# Patient Record
Sex: Male | Born: 1960 | Race: White | Hispanic: No | Marital: Married | Smoking: Former smoker
Health system: Southern US, Community
[De-identification: ages and names within clinical notes are randomized; demographics above are authoritative.]

## PROBLEM LIST (undated history)

## (undated) DIAGNOSIS — M549 Dorsalgia, unspecified: Secondary | ICD-10-CM

## (undated) DIAGNOSIS — F419 Anxiety disorder, unspecified: Secondary | ICD-10-CM

## (undated) DIAGNOSIS — E079 Disorder of thyroid, unspecified: Secondary | ICD-10-CM

## (undated) DIAGNOSIS — E785 Hyperlipidemia, unspecified: Secondary | ICD-10-CM

## (undated) DIAGNOSIS — F32A Depression, unspecified: Secondary | ICD-10-CM

## (undated) DIAGNOSIS — M25569 Pain in unspecified knee: Secondary | ICD-10-CM

## (undated) DIAGNOSIS — K219 Gastro-esophageal reflux disease without esophagitis: Secondary | ICD-10-CM

## (undated) HISTORY — PX: REPLACEMENT TOTAL KNEE: SUR1224

## (undated) HISTORY — PX: HAND SURGERY: SHX662

## (undated) HISTORY — PX: ROTATOR CUFF REPAIR: SHX139

---

## 2010-04-11 ENCOUNTER — Emergency Department (HOSPITAL_BASED_OUTPATIENT_CLINIC_OR_DEPARTMENT_OTHER): Admission: EM | Admit: 2010-04-11 | Discharge: 2010-04-11 | Payer: Self-pay | Admitting: Emergency Medicine

## 2017-06-11 ENCOUNTER — Ambulatory Visit (INDEPENDENT_AMBULATORY_CARE_PROVIDER_SITE_OTHER): Payer: Self-pay

## 2017-06-11 ENCOUNTER — Other Ambulatory Visit: Payer: Self-pay | Admitting: Emergency Medicine

## 2017-06-11 DIAGNOSIS — R52 Pain, unspecified: Secondary | ICD-10-CM

## 2017-06-11 DIAGNOSIS — M19011 Primary osteoarthritis, right shoulder: Secondary | ICD-10-CM

## 2019-10-16 ENCOUNTER — Other Ambulatory Visit: Payer: Self-pay

## 2019-10-16 ENCOUNTER — Emergency Department (INDEPENDENT_AMBULATORY_CARE_PROVIDER_SITE_OTHER): Payer: BC Managed Care – PPO

## 2019-10-16 ENCOUNTER — Emergency Department
Admission: EM | Admit: 2019-10-16 | Discharge: 2019-10-16 | Disposition: A | Discharge status: Home / Self Care | Payer: BC Managed Care – PPO | Source: Home / Self Care | Attending: Family Medicine | Admitting: Family Medicine

## 2019-10-16 DIAGNOSIS — G8929 Other chronic pain: Secondary | ICD-10-CM | POA: Diagnosis not present

## 2019-10-16 DIAGNOSIS — M545 Low back pain, unspecified: Secondary | ICD-10-CM

## 2019-10-16 DIAGNOSIS — M5136 Other intervertebral disc degeneration, lumbar region: Secondary | ICD-10-CM

## 2019-10-16 MED ORDER — PREDNISONE 50 MG PO TABS: ORAL_TABLET | ORAL | 0 refills | Status: DC

## 2019-10-16 NOTE — ED Triage Notes (Signed)
Pt c/o lower back pain x 1 mos. Tried tylenol and blu emu cream with no relief. Has PCP but has not seen her about this issue.

## 2019-10-16 NOTE — Discharge Instructions (Addendum)
Apply ice pack to lower back for 20 to 30 minutes, 3 to 4 times daily  Continue until pain and swelling decrease.  After finishing prednisone, may begin Ibuprofen 200mg , 4 tabs every 8 hours with food.

## 2019-10-16 NOTE — ED Provider Notes (Signed)
[facility]    CSN: [id number] Arrival date & time: 10/16/19  1536      History   Chief Complaint Chief Complaint  Patient presents with  . Back Pain    lower    HPI Steven Becker is a 58 y.o. male.   Patient has a history of recurring low back pain.  About a month ago he developed increased pain after using a lawn aerator, and during the past week his pain has been constant.  His pain is primarily in his right lower back and does not radiate.   He denies bowel or bladder dysfunction, and no saddle numbness.  Review of chart records:  LS spine X-ray ([facility]) done 02/03/16 revealed mild multilevel disc space narrowing and endplate osteophyte formation.  The history is provided by the patient.  Back Pain Location:  Lumbar spine Quality:  Aching Radiates to:  Does not radiate Pain severity:  Moderate Pain is:  Worse during the day Onset quality:  Gradual Duration:  1 month Timing:  Constant Progression:  Worsening Chronicity:  Recurrent Context: lifting heavy objects   Relieved by:  Nothing Worsened by:  Ambulation, bending, movement and twisting Ineffective treatments: Tylenol. Associated symptoms: no abdominal pain, no abdominal swelling, no bladder incontinence, no bowel incontinence, no dysuria, no fever, no leg pain, no numbness, no paresthesias, no pelvic pain, no perianal numbness, no tingling, no weakness and no weight loss   Risk factors: obesity     History reviewed. No pertinent past medical history.  There are no active problems to display for this patient.   History reviewed. No pertinent surgical history.     Home Medications    Prior to Admission medications   Medication Sig Start Date End Date Taking? Authorizing Provider  fenofibrate 54 MG tablet Take by mouth. 10/01/19  Yes [provider]  fluticasone (FLONASE) 50 MCG/ACT nasal spray Place into the nose. 12/23/18  Yes [provider]  levothyroxine (SYNTHROID) 75 MCG  tablet TAKE 1 TABLET BY MOUTH EVERY DAY 03/08/19  Yes [provider]  omeprazole (PRILOSEC OTC) 20 MG tablet Take by mouth. 03/08/19  Yes [provider]  sertraline (ZOLOFT) 50 MG tablet Take one tablet (50 MG Dose) by mouth daily. 03/08/19  Yes [provider]  simvastatin (ZOCOR) 40 MG tablet TAKE 1 TABLET BY MOUTH EVERYDAY AT BEDTIME 08/31/19  Yes [provider]  predniSONE (DELTASONE) 50 MG tablet Take one tab by mouth with food once daily for five days 10/16/19   Kandra Nicolas, MD  Testosterone 20.25 MG/ACT (1.62%) GEL SMARTSIG:3 Pump Topical Daily 09/11/19   [provider]    Family History History reviewed. No pertinent family history.  Social History Social History   Tobacco Use  . Smoking status: Never Smoker  . Smokeless tobacco: Never Used  Substance Use Topics  . Alcohol use: Yes    Comment: occ  . Drug use: Not on file     Allergies   Hydrocodone   Review of Systems Review of Systems  Constitutional: Positive for activity change. Negative for appetite change, chills, diaphoresis, fatigue, fever, unexpected weight change and weight loss.  Gastrointestinal: Negative for abdominal pain and bowel incontinence.  Genitourinary: Negative for bladder incontinence, dysuria and pelvic pain.  Musculoskeletal: Positive for back pain.  Skin: Negative.   Neurological: Negative for tingling, weakness, numbness and paresthesias.  All other systems reviewed and are negative.    Physical Exam Triage Vital Signs ED Triage Vitals  Enc Vitals Group     BP 10/16/19 1558 (!) 145/87     Pulse Rate 10/16/19 1558 86     Resp 10/16/19 1558 18     Temp 10/16/19 1558 98.6 F (37 C)     Temp Source 10/16/19 1558 Oral     SpO2 10/16/19 1558 97 %     Weight 10/16/19 1559 299 lb (135.6 kg)     Height 10/16/19 1559 6' (1.829 m)     Head Circumference --      Peak Flow --      Pain Score 10/16/19 1559 8     Pain Loc --      Pain Edu? --       Excl. in GC? --    No data found.  Updated Vital Signs BP (!) 145/87 (BP Location: Right Arm)   Pulse 86   Temp 98.6 F (37 C) (Oral)   Resp 18   Ht 6' (1.829 m)   Wt 135.6 kg   SpO2 97%   BMI 40.55 kg/m   Visual Acuity Right Eye Distance:   Left Eye Distance:   Bilateral Distance:    Right Eye Near:   Left Eye Near:    Bilateral Near:     Physical Exam Vitals signs and nursing note reviewed.  Constitutional:      General: He is not in acute distress.    Appearance: He is obese.  HENT:     Head: Normocephalic.     Nose: Nose normal.     Mouth/Throat:     Pharynx: Oropharynx is clear.  Eyes:     Pupils: Pupils are equal, round, and reactive to light.  Neck:     Musculoskeletal: Normal range of motion.  Cardiovascular:     Heart sounds: Normal heart sounds.  Pulmonary:     Breath sounds: Normal breath sounds.  Abdominal:     Tenderness: There is no abdominal tenderness.  Musculoskeletal:       Back:     Right lower leg: No edema.     Left lower leg: No edema.     Comments: Back:  Range of motion relatively well preserved.  Can heel/toe walk and squat without difficulty.    Tenderness in the midline from L3 to Sacral area.  There is right paraspinous tenderness from approximately L2 to sacrum.  There is mild left paraspinous tenderness as noted on diagram.  Straight leg raising test is negative.  Sitting knee extension test is negative.  Strength and sensation in the lower extremities is normal.  Patellar and achilles reflexes are normal   Skin:    General: Skin is warm and dry.     Findings: No rash.  Neurological:     Mental Status: He is alert.      UC Treatments / Results  Labs (all labs ordered are listed, but only abnormal results are displayed) Labs Reviewed - No data to display  EKG   Radiology Dg Lumbar Spine Complete  Result Date: 10/16/2019 CLINICAL DATA:  Low back pain, right greater than left, for 1 month. No reported injury.  EXAM: LUMBAR SPINE - COMPLETE 4+ VIEW COMPARISON:  None. FINDINGS: This report assumes 5 non rib-bearing lumbar vertebrae. Lumbar vertebral body heights are preserved, with no fracture. Moderate multilevel lumbar degenerative disc disease, most prominent at L5-S1. Minimal 3 mm retrolisthesis at L1-2. Mild bilateral lower lumbar facet arthropathy. No aggressive appearing focal osseous lesions. IMPRESSION: 1. No lumbar spine fracture. 2.  Moderate multilevel lumbar degenerative disc disease, most prominent at L5-S1. 3. Minimal 3 mm retrolisthesis at L1-2. 4. Mild bilateral lower lumbar facet arthropathy. Electronically Signed   By: Delbert Phenix M.D.   On: 10/16/2019 17:36    Procedures Procedures (including critical care time)  Medications Ordered in UC Medications - No data to display  Initial Impression / Assessment and Plan / UC Course  I have reviewed the triage vital signs and the nursing notes.  Pertinent labs & imaging results that were available during my care of the patient were reviewed by me and considered in my medical decision making (see chart for details).    Suspect a component of L5-S1 radiculopathy Begin prednisone 50mg  daily for 5 days. Followup with Dr. for further evaluation   Final Clinical Impressions(s) / UC Diagnoses   Final diagnoses:  Chronic right-sided low back pain without sciatica     Discharge Instructions     Apply ice pack to lower back for 20 to 30 minutes, 3 to 4 times daily  Continue until pain and swelling decrease.  After finishing prednisone, may begin Ibuprofen 200mg , 4 tabs every 8 hours with food.     ED Prescriptions    Medication Sig Dispense Auth. Provider   predniSONE (DELTASONE) 50 MG tablet Take one tab by mouth with food once daily for five days 5 tablet Steven Langton, MD        , MD 10/17/19 [phone]

## 2020-02-10 ENCOUNTER — Other Ambulatory Visit: Payer: Self-pay | Admitting: Internal Medicine

## 2020-02-10 DIAGNOSIS — U071 COVID-19: Secondary | ICD-10-CM

## 2020-02-10 NOTE — Progress Notes (Signed)
  I connected by phone with Steven Becker on 02/10/2020 at 11:59 AM to discuss the potential use of an new treatment for mild to moderate COVID-19 viral infection in non-hospitalized patients.  This patient is a 59 y.o. male that meets the FDA criteria for Emergency Use Authorization of bamlanivimab or casirivimab\imdevimab.  Has a (+) direct SARS-CoV-2 viral test result  Has mild or moderate COVID-19   Is ? 60 years of age and weighs ? 40 kg  Is NOT hospitalized due to COVID-19  Is NOT requiring oxygen therapy or requiring an increase in baseline oxygen flow rate due to COVID-19  Is within 10 days of symptom onset  Has at least one of the high risk factor(s) for progression to severe COVID-19 and/or hospitalization as defined in EUA.  Specific high risk criteria : BMI >/= 35   I have spoken and communicated the following to the patient or parent/caregiver:  1. FDA has authorized the emergency use of bamlanivimab and casirivimab\imdevimab for the treatment of mild to moderate COVID-19 in adults and pediatric patients with positive results of direct SARS-CoV-2 viral testing who are 90 years of age and older weighing at least 40 kg, and who are at high risk for progressing to severe COVID-19 and/or hospitalization.  2. The significant known and potential risks and benefits of bamlanivimab and casirivimab\imdevimab, and the extent to which such potential risks and benefits are unknown.  3. Information on available alternative treatments and the risks and benefits of those alternatives, including clinical trials.  4. Patients treated with bamlanivimab and casirivimab\imdevimab should continue to self-isolate and use infection control measures (e.g., wear mask, isolate, social distance, avoid sharing personal items, clean and disinfect "high touch" surfaces, and frequent handwashing) according to CDC guidelines.   5. The patient or parent/caregiver has the option to accept or refuse  bamlanivimab or casirivimab\imdevimab .  After reviewing this information with the patient, The patient agreed to proceed with receiving the bamlanimivab infusion and will be provided a copy of the Fact sheet prior to receiving the infusion.   Infusion scheduled for 2/19 at 1430.   Cyndee Brightly, NP-C [facility]  pgr [phone]  02/10/2020 11:59 AM

## 2020-02-11 ENCOUNTER — Encounter (HOSPITAL_COMMUNITY): Payer: Self-pay

## 2020-02-11 ENCOUNTER — Ambulatory Visit (HOSPITAL_COMMUNITY)
Admission: RE | Admit: 2020-02-11 | Discharge: 2020-02-11 | Disposition: A | Discharge status: Home / Self Care | Payer: BC Managed Care – PPO | Source: Ambulatory Visit | Attending: Pulmonary Disease | Admitting: Pulmonary Disease

## 2020-02-11 DIAGNOSIS — U071 COVID-19: Secondary | ICD-10-CM | POA: Diagnosis not present

## 2020-02-11 MED ORDER — EPINEPHRINE 0.3 MG/0.3ML IJ SOAJ: 0.3000 mg | Freq: Once | INTRAMUSCULAR | Status: DC | PRN

## 2020-02-11 MED ORDER — ALBUTEROL SULFATE HFA 108 (90 BASE) MCG/ACT IN AERS: 2.0000 | INHALATION_SPRAY | Freq: Once | RESPIRATORY_TRACT | Status: DC | PRN

## 2020-02-11 MED ORDER — METHYLPREDNISOLONE SODIUM SUCC 125 MG IJ SOLR: 125.0000 mg | Freq: Once | INTRAMUSCULAR | Status: DC | PRN

## 2020-02-11 MED ORDER — DIPHENHYDRAMINE HCL 50 MG/ML IJ SOLN: 50.0000 mg | Freq: Once | INTRAMUSCULAR | Status: DC | PRN

## 2020-02-11 MED ORDER — SODIUM CHLORIDE 0.9 % IV SOLN
700.0000 mg | Freq: Once | INTRAVENOUS | Status: AC
  Administered 2020-02-11: 15:00:00 700 mg via INTRAVENOUS
  Filled 2020-02-11: qty 20

## 2020-02-11 MED ORDER — SODIUM CHLORIDE 0.9 % IV SOLN
INTRAVENOUS | Status: DC | PRN
  Administered 2020-02-11: 15:00:00 250 mL via INTRAVENOUS

## 2020-02-11 MED ORDER — FAMOTIDINE IN NACL 20-0.9 MG/50ML-% IV SOLN: 20.0000 mg | Freq: Once | INTRAVENOUS | Status: DC | PRN

## 2020-02-11 NOTE — Discharge Instructions (Signed)
10 Things You Can Do to Manage Your COVID-19 Symptoms at Home If you have possible or confirmed COVID-19: 1. Stay home from work and school. And stay away from other public places. If you must go out, avoid using any kind of public transportation, ridesharing, or taxis. 2. Monitor your symptoms carefully. If your symptoms get worse, call your healthcare provider immediately. 3. Get rest and stay hydrated. 4. If you have a medical appointment, call the healthcare provider ahead of time and tell them that you have or may have COVID-19. 5. For medical emergencies, call 911 and notify the dispatch personnel that you have or may have COVID-19. 6. Cover your cough and sneezes with a tissue or use the inside of your elbow. 7. Wash your hands often with soap and water for at least 20 seconds or clean your hands with an alcohol-based hand sanitizer that contains at least 60% alcohol. 8. As much as possible, stay in a specific room and away from other people in your home. Also, you should use a separate bathroom, if available. If you need to be around other people in or outside of the home, wear a mask. 9. Avoid sharing personal items with other people in your household, like dishes, towels, and bedding. 10. Clean all surfaces that are touched often, like counters, tabletops, and doorknobs. Use household cleaning sprays or wipes according to the label instructions. cdc.gov/coronavirus 06/23/2019 This information is not intended to replace advice given to you by your health care provider. Make sure you discuss any questions you have with your health care provider. Document Revised: 11/25/2019 Document Reviewed: 11/25/2019 Elsevier Patient Education  2020 Elsevier Inc. Mr.  

## 2020-02-11 NOTE — Progress Notes (Signed)
  Diagnosis: COVID-19  Physician: Dr. Wright  Procedure: [facility] Med: bamlanivimab infusion - Provided patient with bamlanimivab fact sheet for patients, parents and caregivers prior to infusion.  Complications: No immediate complications noted.  Discharge: Discharged home   Steven Becker N [name] 02/11/2020  

## 2020-03-02 IMAGING — DX DG LUMBAR SPINE COMPLETE 4+V
5 series · 5 of 5 positions shown · non-contrast
Comparison: None.

CLINICAL DATA: Low back pain, right greater than left, for 1 month.
No reported injury.

EXAM:
LUMBAR SPINE - COMPLETE 4+ VIEW

[l-spine ap]
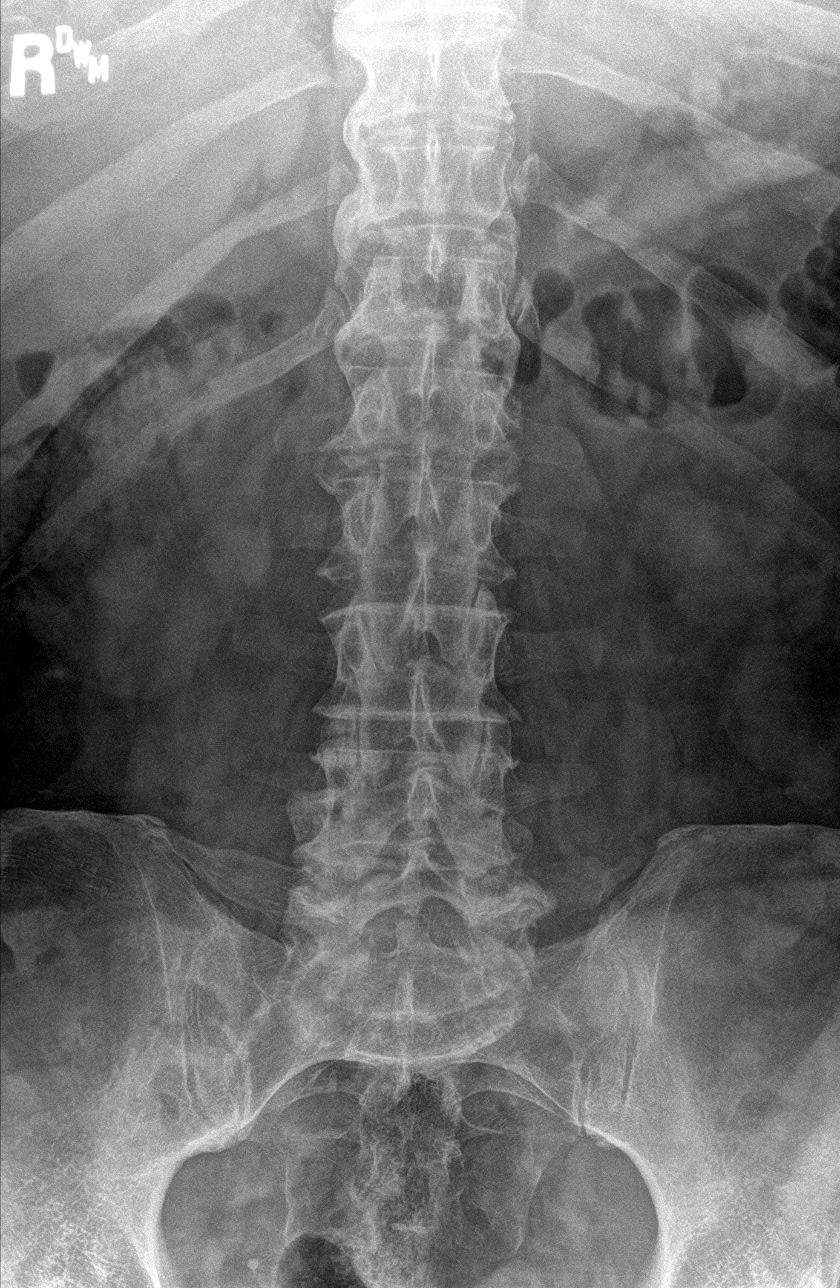

[l-spine obl (1 of 2)]
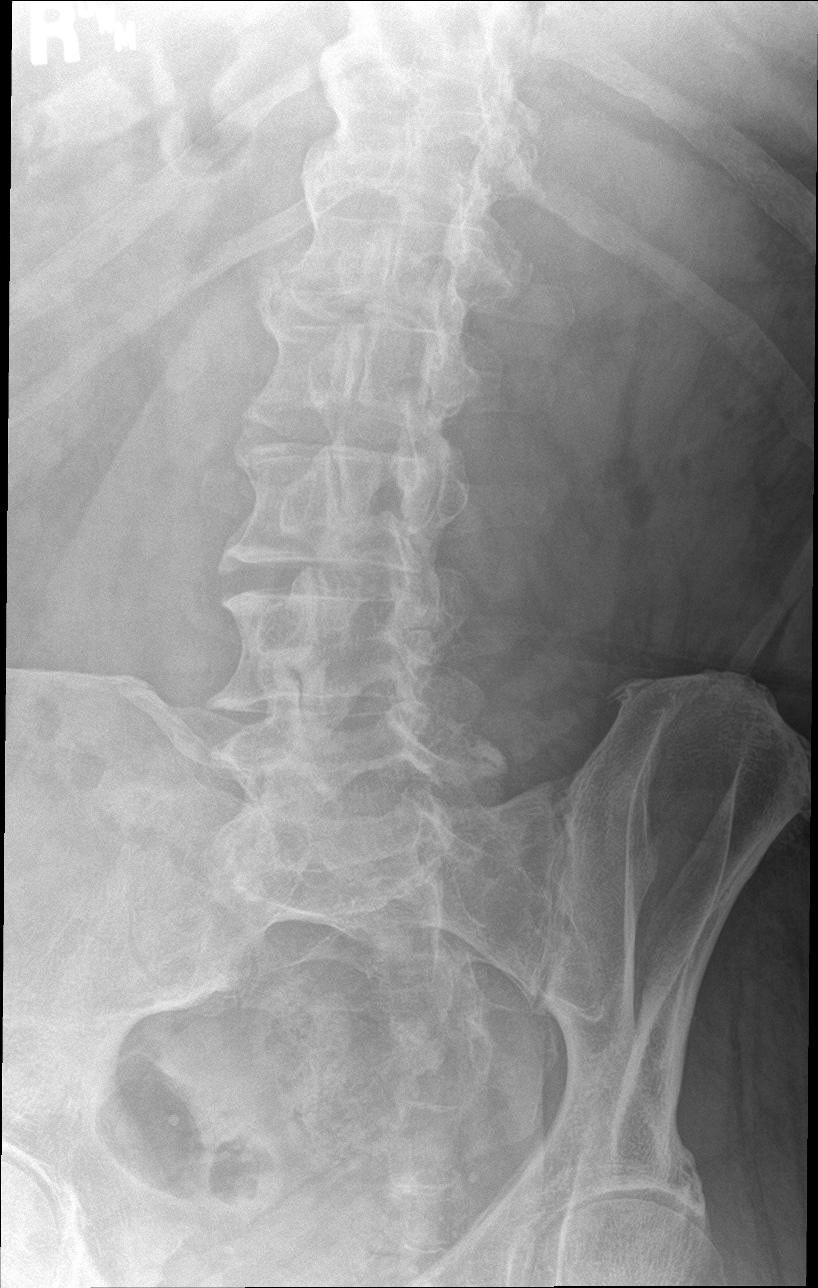

[l-spine obl (2 of 2)]
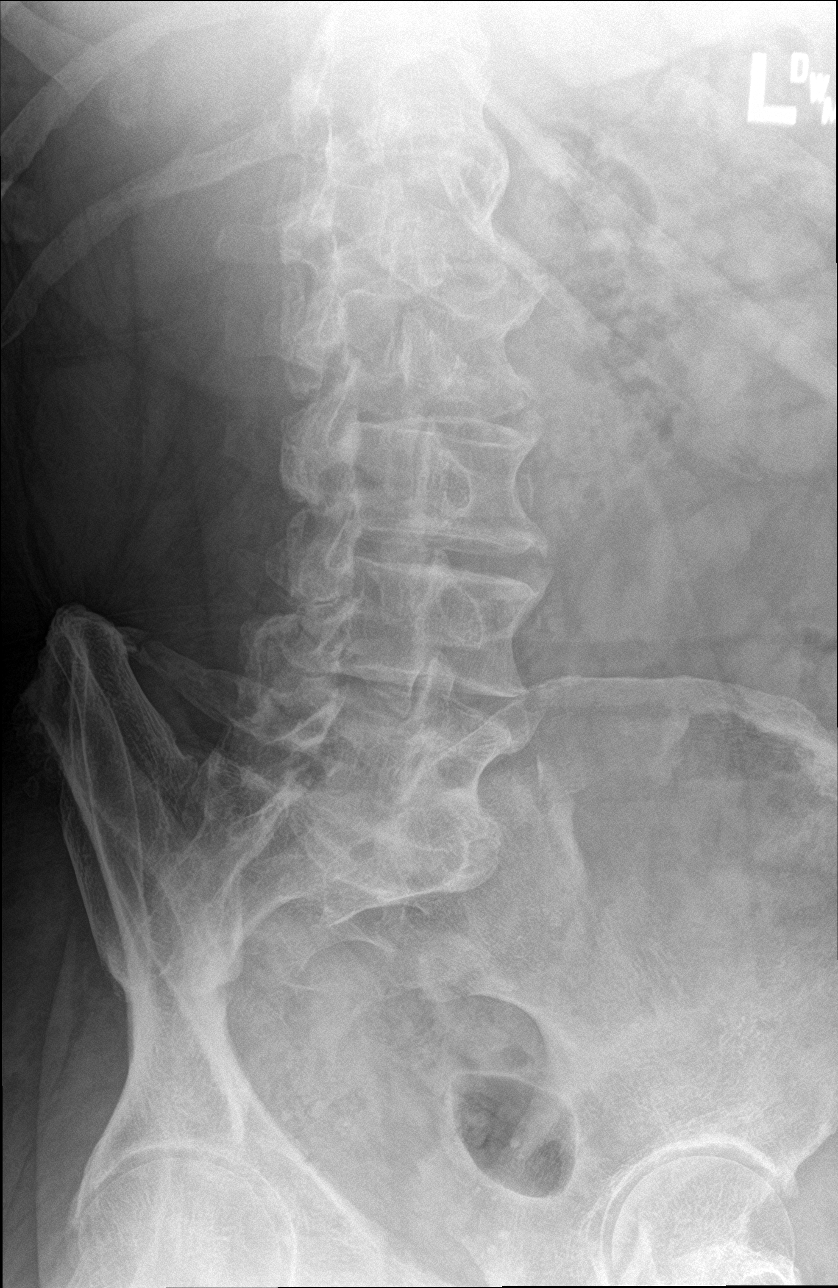

[l-spine lat (1 of 2)]
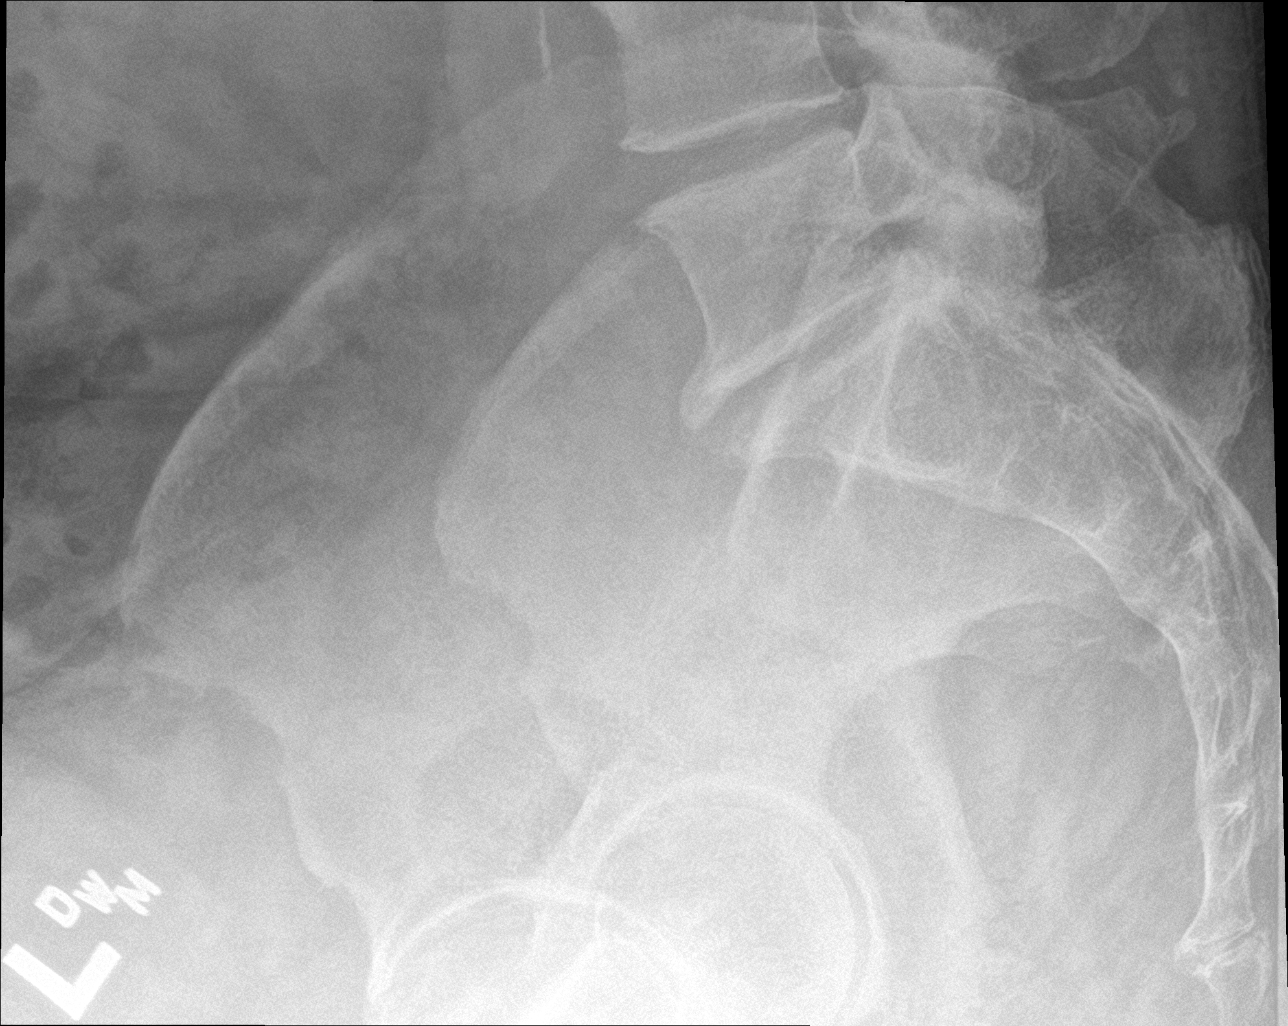

[l-spine lat (2 of 2)]
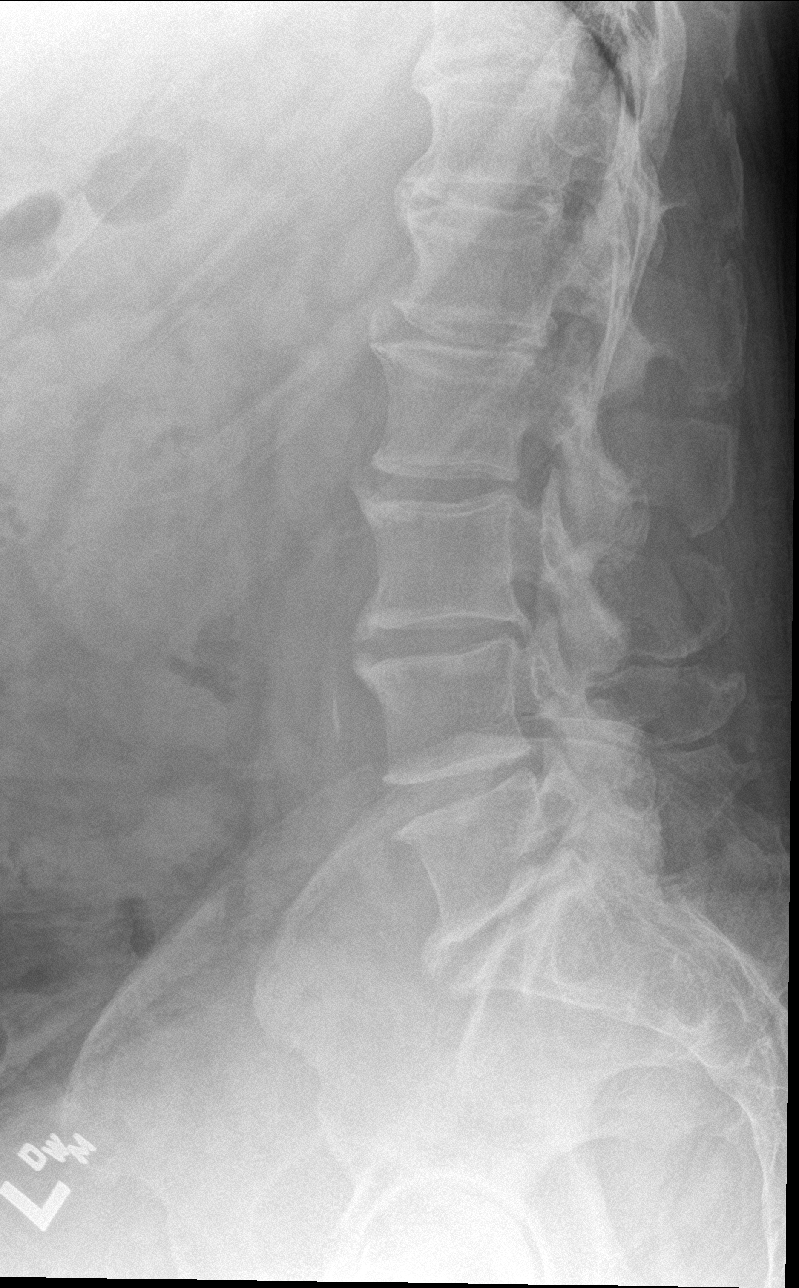

[5 of 5 positions shown; findings below may reference images not displayed]

FINDINGS: This report assumes 5 non rib-bearing lumbar vertebrae.

Lumbar vertebral body heights are preserved, with no fracture.

Moderate multilevel lumbar degenerative disc disease, most prominent
at L5-S1. Minimal 3 mm retrolisthesis at L1-2. Mild bilateral lower
lumbar facet arthropathy. No aggressive appearing focal osseous
lesions.
IMPRESSION: 1. No lumbar spine fracture.
2. Moderate multilevel lumbar degenerative disc disease, most
prominent at L5-S1.
3. Minimal 3 mm retrolisthesis at L1-2.
4. Mild bilateral lower lumbar facet arthropathy.

## 2020-07-01 ENCOUNTER — Emergency Department
Admission: RE | Admit: 2020-07-01 | Discharge: 2020-07-01 | Disposition: A | Discharge status: Home / Self Care | Payer: BC Managed Care – PPO | Source: Ambulatory Visit

## 2020-07-01 ENCOUNTER — Other Ambulatory Visit: Payer: Self-pay

## 2020-07-01 VITALS — BP 173/92 | HR 68 | Temp 98.2°F | Resp 18 | Ht 72.0 in | Wt 290.0 lb

## 2020-07-01 DIAGNOSIS — M5441 Lumbago with sciatica, right side: Secondary | ICD-10-CM

## 2020-07-01 HISTORY — DX: Pain in unspecified knee: M25.569

## 2020-07-01 HISTORY — DX: Disorder of thyroid, unspecified: E07.9

## 2020-07-01 HISTORY — DX: Dorsalgia, unspecified: M54.9

## 2020-07-01 HISTORY — DX: Gastro-esophageal reflux disease without esophagitis: K21.9

## 2020-07-01 HISTORY — DX: Depression, unspecified: F32.A

## 2020-07-01 HISTORY — DX: Anxiety disorder, unspecified: F41.9

## 2020-07-01 HISTORY — DX: Hyperlipidemia, unspecified: E78.5

## 2020-07-01 MED ORDER — KETOROLAC TROMETHAMINE 60 MG/2ML IM SOLN
60.0000 mg | Freq: Once | INTRAMUSCULAR | Status: AC
  Administered 2020-07-01: 60 mg via INTRAMUSCULAR

## 2020-07-01 MED ORDER — METHOCARBAMOL 500 MG PO TABS: 500.0000 mg | ORAL_TABLET | Freq: Two times a day (BID) | ORAL | 0 refills | Status: DC

## 2020-07-01 MED ORDER — PREDNISONE 20 MG PO TABS: 40.0000 mg | ORAL_TABLET | Freq: Every day | ORAL | 0 refills | Status: AC

## 2020-07-01 MED ORDER — DEXAMETHASONE SODIUM PHOSPHATE 10 MG/ML IJ SOLN
10.0000 mg | Freq: Once | INTRAMUSCULAR | Status: AC
  Administered 2020-07-01: 10 mg via INTRAMUSCULAR

## 2020-07-01 NOTE — ED Triage Notes (Signed)
Patient states has had back problem for about a year; exacerbated 6 days ago after moving furniture; has had MRI in past and has been in pain clinic. Using heat and ibuprofen. Has had covid vaccinations.

## 2020-07-01 NOTE — ED Provider Notes (Signed)
[facility]    CSN: [id number] Arrival date & time: 07/01/20  1039      History   Chief Complaint Chief Complaint  Patient presents with  . Back Pain    HPI Steven Becker is a 59 y.o. male.   HPI  Steven Becker is a 59 y.o. male presenting to UC with c/o exacerbation of Right low back pain that occasionally radiates down Right thigh for the last 6 days after moving a mattress.  He had an MRI of his back in the past but surgery was not recommended at that time. He has known DDD.  He has done well with prednisone and muscle relaxers in the past. He has tried Meloxicam in the past without relief.  He has most recently tried Tylenol and ibuprofen with heat with mild relief.   Past Medical History:  Diagnosis Date  . Anxiety and depression   . Back pain   . GERD (gastroesophageal reflux disease)   . Hyperlipidemia   . Knee pain   . Thyroid disease     There are no problems to display for this patient.   Past Surgical History:  Procedure Laterality Date  . HAND SURGERY    . REPLACEMENT TOTAL KNEE    . ROTATOR CUFF REPAIR Bilateral        Home Medications    Prior to Admission medications   Medication Sig Start Date End Date Taking? Authorizing Provider  meloxicam (MOBIC) 15 MG tablet Take 15 mg by mouth at bedtime.   Yes [provider]  fenofibrate 54 MG tablet Take by mouth. 10/01/19   [provider]  fluticasone (FLONASE) 50 MCG/ACT nasal spray Place into the nose. 12/23/18   [provider]  levothyroxine (SYNTHROID) 75 MCG tablet TAKE 1 TABLET BY MOUTH EVERY DAY 03/08/19   [provider]  methocarbamol (ROBAXIN) 500 MG tablet Take 1 tablet (500 mg total) by mouth 2 (two) times daily. 07/01/20   Lurene Shadow, PA-C  omeprazole (PRILOSEC OTC) 20 MG tablet Take by mouth. 03/08/19   [provider]  predniSONE (DELTASONE) 20 MG tablet Take 2 tablets (40 mg total) by mouth daily with breakfast for 4 days. 07/01/20  07/05/20  Lurene Shadow, PA-C  sertraline (ZOLOFT) 50 MG tablet Take one tablet (50 MG Dose) by mouth daily. 03/08/19   [provider]  simvastatin (ZOCOR) 40 MG tablet TAKE 1 TABLET BY MOUTH EVERYDAY AT BEDTIME 08/31/19   [provider]  Testosterone 20.25 MG/ACT (1.62%) GEL SMARTSIG:3 Pump Topical Daily 09/11/19   [provider]    Family History No family history on file.  Social History Social History   Tobacco Use  . Smoking status: Former Games developer  . Smokeless tobacco: Former Clinical biochemist  . Vaping Use: Never used  Substance Use Topics  . Alcohol use: Yes    Comment: occ  . Drug use: Not on file     Allergies   Hydrocodone   Review of Systems Review of Systems  Genitourinary: Negative for dysuria, frequency and hematuria.  Musculoskeletal: Positive for back pain and myalgias. Negative for gait problem.  Neurological: Negative for weakness and numbness.     Physical Exam Triage Vital Signs ED Triage Vitals  Enc Vitals Group     BP 07/01/20 1126 (!) 173/92     Pulse Rate 07/01/20 1126 68     Resp 07/01/20 1126 18     Temp 07/01/20 1126 98.2 F (36.8  C)     Temp Source 07/01/20 1126 Oral     SpO2 07/01/20 1126 96 %     Weight 07/01/20 1129 290 lb (131.5 kg)     Height 07/01/20 1129 6' (1.829 m)     Head Circumference --      Peak Flow --      Pain Score 07/01/20 1129 7     Pain Loc --      Pain Edu? --      Excl. in GC? --    No data found.  Updated Vital Signs BP (!) 173/92 (BP Location: Right Arm)   Pulse 68   Temp 98.2 F (36.8 C) (Oral)   Resp 18   Ht 6' (1.829 m)   Wt 290 lb (131.5 kg)   SpO2 96%   BMI 39.33 kg/m   Visual Acuity Right Eye Distance:   Left Eye Distance:   Bilateral Distance:    Right Eye Near:   Left Eye Near:    Bilateral Near:     Physical Exam Vitals and nursing note reviewed.  Constitutional:      Appearance: Normal appearance. He is well-developed.  HENT:     Head:  Normocephalic and atraumatic.  Cardiovascular:     Rate and Rhythm: Normal rate and regular rhythm.  Pulmonary:     Effort: Pulmonary effort is normal. No respiratory distress.     Breath sounds: Normal breath sounds.  Musculoskeletal:        General: Tenderness present. Normal range of motion.     Cervical back: Normal range of motion.       Back:     Comments: Full ROM upper and lower extremities, 5/5 strength. No spinal tenderness.   Skin:    General: Skin is warm and dry.     Findings: No bruising or erythema.  Neurological:     Mental Status: He is alert and oriented to person, place, and time.  Psychiatric:        Behavior: Behavior normal.      UC Treatments / Results  Labs (all labs ordered are listed, but only abnormal results are displayed) Labs Reviewed - No data to display  EKG   Radiology No results found.  Procedures Procedures (including critical care time)  Medications Ordered in UC Medications  dexamethasone (DECADRON) injection 10 mg (10 mg Intramuscular Given 07/01/20 1214)  ketorolac (TORADOL) injection 60 mg (60 mg Intramuscular Given 07/01/20 1214)    Initial Impression / Assessment and Plan / UC Course  I have reviewed the triage vital signs and the nursing notes.  Pertinent labs & imaging results that were available during my care of the patient were reviewed by me and considered in my medical decision making (see chart for details).     Hx and exam c/w muscle strain Toradol 60mg  IM and decadron 10mg  IM given in UC Encouraged f/u with PCP for elevated blood pressure and follow up with Sports Medicine for back. AVS given  Final Clinical Impressions(s) / UC Diagnoses   Final diagnoses:  Acute right-sided low back pain with right-sided sciatica     Discharge Instructions      You were given a shot of depomedrol (a steroid) today to help with muscle pain and swelling.  You have been prescribed prednisone, an oral steroid.  You may  start this medication tomorrow with breakfast.    Robaxin (methocarbamol) is a muscle relaxer and may cause drowsiness. Do not drink alcohol, drive, or  operate heavy machinery while taking.   Call to schedule a follow up appointment with Sports Medicine next week if not improving.     ED Prescriptions    Medication Sig Dispense Auth. Provider   methocarbamol (ROBAXIN) 500 MG tablet Take 1 tablet (500 mg total) by mouth 2 (two) times daily. 20 tablet Doroteo Glassman, Caylan Schifano O, PA-C   predniSONE (DELTASONE) 20 MG tablet Take 2 tablets (40 mg total) by mouth daily with breakfast for 4 days. 8 tablet Lurene Shadow, [place]     PDMP not reviewed this encounter.   Lurene Shadow, PA-C 07/01/20 1309

## 2020-07-01 NOTE — Discharge Instructions (Signed)
  You were given a shot of depomedrol (a steroid) today to help with muscle pain and swelling.  You have been prescribed prednisone, an oral steroid.  You may start this medication tomorrow with breakfast.    Robaxin (methocarbamol) is a muscle relaxer and may cause drowsiness. Do not drink alcohol, drive, or operate heavy machinery while taking.   Call to schedule a follow up appointment with Sports Medicine next week if not improving.

## 2020-07-07 ENCOUNTER — Emergency Department
Admission: RE | Admit: 2020-07-07 | Discharge: 2020-07-07 | Disposition: A | Discharge status: Home / Self Care | Payer: BC Managed Care – PPO | Source: Ambulatory Visit | Attending: Emergency Medicine | Admitting: Emergency Medicine

## 2020-07-07 ENCOUNTER — Other Ambulatory Visit: Payer: Self-pay

## 2020-07-07 VITALS — BP 167/99 | HR 72 | Temp 98.2°F | Resp 18

## 2020-07-07 DIAGNOSIS — M5136 Other intervertebral disc degeneration, lumbar region: Secondary | ICD-10-CM

## 2020-07-07 DIAGNOSIS — M5431 Sciatica, right side: Secondary | ICD-10-CM | POA: Diagnosis not present

## 2020-07-07 MED ORDER — METHYLPREDNISOLONE ACETATE 80 MG/ML IJ SUSP
120.0000 mg | Freq: Once | INTRAMUSCULAR | Status: AC
  Administered 2020-07-07: 120 mg via INTRAMUSCULAR

## 2020-07-07 MED ORDER — CARISOPRODOL 350 MG PO TABS: 350.0000 mg | ORAL_TABLET | Freq: Three times a day (TID) | ORAL | 0 refills | Status: DC

## 2020-07-07 MED ORDER — PREDNISONE 20 MG PO TABS: 20.0000 mg | ORAL_TABLET | Freq: Two times a day (BID) | ORAL | 0 refills | Status: DC

## 2020-07-07 NOTE — ED Triage Notes (Signed)
Patient presents to Urgent Care with complaints of mid-right lower back pain since a year ago. Patient reports he had DDD in his lower back, got an injection last week and a course of steroids. States he took his last prednisone this morning and now his pain seems to have returned. Pt denies further injury.

## 2020-07-07 NOTE — Discharge Instructions (Addendum)
Keep appt w your ortho, Dr Carles Collet on 07/24/2020. Also, you need to f/u w your pcp to reck BP within 1 week. Continue taking prilosec to help prevent stomach upset from rx's.

## 2020-07-07 NOTE — ED Provider Notes (Signed)
[facility]    CSN: [id number] Arrival date & time: 07/07/20  1710      History   Chief Complaint Chief Complaint  Patient presents with  . Appointment    6:00  . Back Pain  Friday, 6:00 PM 07/07/2020  HPI Steven Becker is a 59 y.o. male.   HPI Patient presents to Urgent Care with complaints of mid-right lower back pain since a year ago, but has flared up the past couple of weeks.  Recalls no specific injury. Patient reports he has diagnosis DDD in his lower back, and he brings in imaging report from January 2021 from [facility] showing "DDD, no disc herniation, no severe stenosis" . Currently, mid lumbar and right paralumbar pain is moderate to severe, both sharp and dull, can radiate down the back of right leg to right knee but not beyond.  No definite numbness or tingling or focal weakness.  No bowel or bladder dysfunction.  No incontinence. He states he got an IM injection last week and a course of p.o. steroids. States he took his last prednisone this morning and now his pain seems to have returned. Pt denies further injury.  He states that he and family are leaving tonight for vacation at the beach, and wife will do the driving.  Patient specifically requesting aggressive treatment here in urgent care, before he leaves on vacation.  He is requesting more corticosteroid, as that has helped his back pain, and he is requesting a different muscle relaxant.   He promises to follow-up with his orthopedic appointment with Dr. Theophilus Bones on 07/24/2020.  Pertinent items noted in HPI and remainder of comprehensive ROS otherwise negative.  Past Medical History:  Diagnosis Date  . Anxiety and depression   . Back pain   . GERD (gastroesophageal reflux disease)   . Hyperlipidemia   . Knee pain   . Thyroid disease     There are no problems to display for this patient.   Past Surgical History:  Procedure Laterality Date  . HAND SURGERY    . REPLACEMENT TOTAL KNEE    .  ROTATOR CUFF REPAIR Bilateral        Home Medications    Prior to Admission medications   Medication Sig Start Date End Date Taking? Authorizing Provider  carisoprodol (SOMA) 350 MG tablet Take 1 tablet (350 mg total) by mouth 3 (three) times daily. For muscle relaxant and back pain- caution, may cause drowsiness 07/07/20   Lajean Manes, MD  fenofibrate 54 MG tablet Take by mouth. 10/01/19   [provider]  fluticasone (FLONASE) 50 MCG/ACT nasal spray Place into the nose. 12/23/18   [provider]  levothyroxine (SYNTHROID) 75 MCG tablet TAKE 1 TABLET BY MOUTH EVERY DAY 03/08/19   [provider]  meloxicam (MOBIC) 15 MG tablet Take 15 mg by mouth at bedtime.    [provider]  omeprazole (PRILOSEC OTC) 20 MG tablet Take by mouth. 03/08/19   [provider]  predniSONE (DELTASONE) 20 MG tablet Take 1 tablet (20 mg total) by mouth 2 (two) times daily with a meal. X 5 days 07/07/20   Lajean Manes, MD  sertraline (ZOLOFT) 50 MG tablet Take one tablet (50 MG Dose) by mouth daily. 03/08/19   [provider]  simvastatin (ZOCOR) 40 MG tablet TAKE 1 TABLET BY MOUTH EVERYDAY AT BEDTIME 08/31/19   [provider]  Testosterone 20.25 MG/ACT (1.62%) GEL SMARTSIG:3 Pump Topical Daily 09/11/19   [provider]  Family History Family History  Problem Relation Age of Onset  . Healthy Father     Social History Social History   Tobacco Use  . Smoking status: Former Games developer  . Smokeless tobacco: Former Clinical biochemist  . Vaping Use: Never used  Substance Use Topics  . Alcohol use: Yes    Comment: occ  . Drug use: Not on file     Allergies   Hydrocodone   Review of Systems Review of Systems   Physical Exam Triage Vital Signs ED Triage Vitals  Enc Vitals Group     BP 07/07/20 1744 (!) 167/99     Pulse Rate 07/07/20 1744 72     Resp 07/07/20 1744 18     Temp 07/07/20 1744 98.2 F (36.8 C)     Temp Source  07/07/20 1744 Oral     SpO2 07/07/20 1744 96 %     Weight --      Height --      Head Circumference --      Peak Flow --      Pain Score 07/07/20 1742 9     Pain Loc --      Pain Edu? --      Excl. in GC? --    No data found.  Updated Vital Signs BP (!) 167/99 (BP Location: Right Arm)   Pulse 72   Temp 98.2 F (36.8 C) (Oral)   Resp 18   SpO2 96%  BP rechecked 151/94  Physical Exam Vitals and nursing note reviewed.  Constitutional:      General: He is in acute distress (Appears uncomfortable from low back pain.).     Appearance: He is well-developed. He is not toxic-appearing.  HENT:     Head: Normocephalic and atraumatic.  Eyes:     General: No scleral icterus.    Pupils: Pupils are equal, round, and reactive to light.  Cardiovascular:     Rate and Rhythm: Regular rhythm.     Heart sounds: Normal heart sounds.  Pulmonary:     Effort: Pulmonary effort is normal. No respiratory distress.     Breath sounds: Normal breath sounds. No wheezing or rales.  Chest:     Chest wall: No tenderness.  Abdominal:     Palpations: Abdomen is soft.     Tenderness: There is no abdominal tenderness.  Musculoskeletal:     Cervical back: Neck supple. No tenderness.     Thoracic back: No tenderness.     Lumbar back:  +Very tender with spasm right paralumbar area.  No definite point tenderness over lumbar spine.  No swelling, edema, deformity or wound, or skin change. + Marked decreased range of motion to flexion and extension and torsion, as these motions exacerbate his pain.  Mildly tender right sciatic notch.    Right hip: Normal.     Left hip: Normal.     Comments:  R straight leg-raise test, mildly positive at 45 degrees. Negative Left straight leg-raise test.  Negative Right Luisa Hart test. Negative Left Luisa Hart test.  Skin:    General: Skin is warm and dry.     Findings: No lesion or rash.  Neurological:     Mental Status: He is alert and oriented to person, place, and time.       Cranial Nerves: No cranial nerve deficit.     Sensory: No sensory deficit.     Motor: No tremor, atrophy or abnormal muscle tone.     Gait: Antalgic  Deep Tendon Reflexes: Reflexes normal.     Reflex Scores:      Patellar reflexes are 2+ on the right side and 2+ on the left side.      Achilles reflexes are 2+ on the right side and 2+ on the left side. Psychiatric:        Behavior: Behavior is cooperative.    UC Treatments / Results  Labs (all labs ordered are listed, but only abnormal results are displayed) Labs Reviewed - No data to display  EKG   Radiology No results found.  Procedures Procedures (including critical care time)  Medications Ordered in UC Medications  methylPREDNISolone acetate (DEPO-MEDROL) injection 120 mg (120 mg Intramuscular Given 07/07/20 1908)    Initial Impression / Assessment and Plan / UC Course  I have reviewed the triage vital signs and the nursing notes.  Pertinent labs & imaging results that were available during my care of the patient were reviewed by me and considered in my medical decision making (see chart for details).    Explained to patient that he likely has is severe musculoligamentous low back strain, but has other findings suggestive of right-sided sciatica.  Nontender over spinous processes, so plain x-ray not indicated at this time. Treatment options discussed and risk benefits alternatives discussed. Depo-Medrol 120 mg IM stat. Prednisone 20 mg twice daily x5 days.  Start taking tomorrow. Carisoprodol for muscle relaxant. Continue on Prilosec while on prednisone. Other advice given such as heat and relative rest, and when applicable, gentle passive range of motion exercises.  Keep appointment with Dr. Theophilus Bones, his orthopedist on 07/24/2020. Red flags discussed and advice given regarding seeking emergency care if any red flags. Advised him that most appropriate care for his back problem is not at urgent care or emergency  room, unless there are any red flags suggesting emergency.  Instead, I urged him to always follow-up with his orthopedist for his back problem.   Final Clinical Impressions(s) / UC Diagnoses   Final diagnoses:  Sciatica of right side  Degenerative disc disease, lumbar     Discharge Instructions     Keep appt w your ortho, Dr Carles Collet on 07/24/2020. Also, you need to f/u w your pcp to reck BP within 1 week. Continue taking prilosec to help prevent stomach upset from rx's.    ED Prescriptions    Medication Sig Dispense Auth. Provider   carisoprodol (SOMA) 350 MG tablet Take 1 tablet (350 mg total) by mouth 3 (three) times daily. For muscle relaxant and back pain- caution, may cause drowsiness 21 tablet Lajean Manes, MD   predniSONE (DELTASONE) 20 MG tablet Take 1 tablet (20 mg total) by mouth 2 (two) times daily with a meal. X 5 days 10 tablet Lajean Manes, MD     I have reviewed the PDMP during this encounter.   Lajean Manes, MD 07/12/20 1109

## 2021-06-22 ENCOUNTER — Emergency Department (INDEPENDENT_AMBULATORY_CARE_PROVIDER_SITE_OTHER)
Admission: EM | Admit: 2021-06-22 | Discharge: 2021-06-22 | Disposition: A | Discharge status: Home / Self Care | Payer: BC Managed Care – PPO | Source: Home / Self Care | Attending: Family Medicine | Admitting: Family Medicine

## 2021-06-22 ENCOUNTER — Encounter: Payer: Self-pay | Admitting: Emergency Medicine

## 2021-06-22 ENCOUNTER — Other Ambulatory Visit: Payer: Self-pay

## 2021-06-22 DIAGNOSIS — J069 Acute upper respiratory infection, unspecified: Secondary | ICD-10-CM

## 2021-06-22 MED ORDER — GUAIFENESIN-CODEINE 100-10 MG/5ML PO SOLN: ORAL | 0 refills | Status: DC

## 2021-06-22 MED ORDER — DOXYCYCLINE HYCLATE 100 MG PO CAPS: ORAL_CAPSULE | ORAL | 0 refills | Status: DC

## 2021-06-22 NOTE — ED Triage Notes (Signed)
Patient states that he believes he has bronchitis.  He began coughing yesterday, chest soreness from coughing, runny nose, afebrile.  Patient has taken Tylenol and Mucinex.  Patient is vaccinated against COVID.

## 2021-06-22 NOTE — Discharge Instructions (Addendum)
Take plain guaifenesin (1200mg  extended release tabs such as Mucinex) twice daily, with plenty of water, for cough and congestion.  Get adequate rest.   May use Afrin nasal spray (or generic oxymetazoline) each morning for about 5 days and then discontinue.  Also recommend using saline nasal spray several times daily and saline nasal irrigation (AYR is a common brand).  Use Flonase nasal spray each morning after using Afrin nasal spray and saline nasal irrigation. Try warm salt water gargles for sore throat.  Stop all antihistamines for now, and other non-prescription cough/cold preparations. May take Tylenol as needed for pain, fever, etc.   If your COVID-19 test is positive, isolate yourself for five days from the time of your symptom onset.  At the end of five days you may end isolation if your symptoms have cleared or improved, and you have not had a fever for 24 hours. At this time you should wear a mask for five more days when you are around others.

## 2021-06-22 NOTE — ED Provider Notes (Signed)
[facility]    CSN: [id number] Arrival date & time: 06/22/21  1640      History   Chief Complaint Chief Complaint  Patient presents with   Bronchitis    HPI Steven Becker is a 60 y.o. male.   Last night patient suddenly developed cough, nasal congestion, mild sore throat, fatigue, and tightness in his anterior chest.  He denies shortness of breath or pleuritic pain.  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  He is vaccinated against COVID19.  The history is provided by the patient.   Past Medical History:  Diagnosis Date   Anxiety and depression    Back pain    GERD (gastroesophageal reflux disease)    Hyperlipidemia    Knee pain    Thyroid disease     There are no problems to display for this patient.   Past Surgical History:  Procedure Laterality Date   HAND SURGERY     REPLACEMENT TOTAL KNEE     ROTATOR CUFF REPAIR Bilateral        Home Medications    Prior to Admission medications   Medication Sig Start Date End Date Taking? Authorizing Provider  amLODipine (NORVASC) 5 MG tablet Take 5 mg by mouth daily. 05/24/21  Yes [provider]  doxycycline (VIBRAMYCIN) 100 MG capsule Take one cap PO Q12hr with food. 06/22/21  Yes Lattie Haw, MD  fenofibrate 54 MG tablet Take by mouth. 10/01/19  Yes [provider]  fluticasone (FLONASE) 50 MCG/ACT nasal spray Place into the nose. 12/23/18  Yes [provider]  guaiFENesin-codeine 100-10 MG/5ML syrup Take 84mL by mouth at bedtime as needed for cough. 06/22/21  Yes Lattie Haw, MD  levothyroxine (SYNTHROID) 75 MCG tablet TAKE 1 TABLET BY MOUTH EVERY DAY 03/08/19  Yes [provider]  lisinopril (ZESTRIL) 20 MG tablet Take 20 mg by mouth daily. 05/07/21  Yes [provider]  meloxicam (MOBIC) 15 MG tablet Take 15 mg by mouth at bedtime.   Yes [provider]  omeprazole (PRILOSEC OTC) 20 MG tablet Take by mouth. 03/08/19  Yes  [provider]  sertraline (ZOLOFT) 50 MG tablet Take one tablet (50 MG Dose) by mouth daily. 03/08/19  Yes [provider]  simvastatin (ZOCOR) 40 MG tablet TAKE 1 TABLET BY MOUTH EVERYDAY AT BEDTIME 08/31/19  Yes [provider]  Testosterone 20.25 MG/ACT (1.62%) GEL SMARTSIG:3 Pump Topical Daily 09/11/19  Yes [provider]  carisoprodol (SOMA) 350 MG tablet Take 1 tablet (350 mg total) by mouth 3 (three) times daily. For muscle relaxant and back pain- caution, may cause drowsiness 07/07/20   Lajean Manes, MD  predniSONE (DELTASONE) 20 MG tablet Take 1 tablet (20 mg total) by mouth 2 (two) times daily with a meal. X 5 days 07/07/20   Lajean Manes, MD    Family History Family History  Problem Relation Age of Onset   Healthy Father     Social History Social History   Tobacco Use   Smoking status: Former    Pack years: 0.00   Smokeless tobacco: Former  Building services engineer Use: Never used  Substance Use Topics   Alcohol use: Yes    Comment: occ     Allergies   Hydrocodone   Review of Systems Review of Systems + sore throat + cough No pleuritic pain but feels tight in anterior chest No wheezing + nasal congestion + post-nasal drainage  No sinus pain/pressure No itchy/red eyes No earache No hemoptysis No SOB No fever/chills No nausea No vomiting No abdominal pain No diarrhea No urinary symptoms No skin rash + fatigue No myalgias + headache Used OTC meds (Tylenol) without relief   Physical Exam Triage Vital Signs ED Triage Vitals  Enc Vitals Group     BP 06/22/21 1714 132/85     Pulse Rate 06/22/21 1714 (!) 122     Resp --      Temp 06/22/21 1714 99.2 F (37.3 C)     Temp Source 06/22/21 1714 Oral     SpO2 06/22/21 1714 96 %     Weight --      Height --      Head Circumference --      Peak Flow --      Pain Score 06/22/21 1715 8     Pain Loc --      Pain Edu? --      Excl. in GC? --    No data found.  Updated  Vital Signs BP 132/85 (BP Location: Right Arm)   Pulse (!) 122   Temp 99.2 F (37.3 C) (Oral)   SpO2 96%   Visual Acuity Right Eye Distance:   Left Eye Distance:   Bilateral Distance:    Right Eye Near:   Left Eye Near:    Bilateral Near:     Physical Exam Nursing notes and Vital Signs reviewed. Appearance:  Patient appears stated age, and in no acute distress Eyes:  Pupils are equal, round, and reactive to light and accomodation.  Extraocular movement is intact.  Conjunctivae are not inflamed  Ears:  Canals normal.  Tympanic membranes normal.  Nose:  Congested turbinates.  No sinus tenderness.   Pharynx:  Normal Neck:  Supple.  Mildly enlarged lateral nodes are present, tender to palpation on the left.   Lungs:  Clear to auscultation.  Breath sounds are equal.  Moving air well. Heart:  Regular rate and rhythm without murmurs, rubs, or gallops.  Abdomen:  Nontender without masses or hepatosplenomegaly.  Bowel sounds are present.  No CVA or flank tenderness.  Extremities:  No edema.  Skin:  No rash present.   UC Treatments / Results  Labs (all labs ordered are listed, but only abnormal results are displayed) Labs Reviewed  NOVEL CORONAVIRUS, NAA    EKG   Radiology No results found.  Procedures Procedures (including critical care time)  Medications Ordered in UC Medications - No data to display  Initial Impression / Assessment and Plan / UC Course  I have reviewed the triage vital signs and the nursing notes.  Pertinent labs & imaging results that were available during my care of the patient were reviewed by me and considered in my medical decision making (see chart for details).    COVID19 PCR pending. Because patient has a past history of lung inhalation injury, will begin emiric doxycycline.  Rx for Robitussin AC HS (#29mL, no refill). Controlled Substance Prescriptions I have consulted the [place] Controlled Substances Registry for this patient, and feel the  risk/benefit ratio today is favorable for proceeding with this prescription for a controlled substance.   Followup with Family Doctor if not improved in 10 days.  Final Clinical Impressions(s) / UC Diagnoses   Final diagnoses:  Viral URI with cough     Discharge Instructions      Take plain guaifenesin (1200mg  extended release tabs such as Mucinex) twice daily, with plenty of water,  for cough and congestion.  Get adequate rest.   May use Afrin nasal spray (or generic oxymetazoline) each morning for about 5 days and then discontinue.  Also recommend using saline nasal spray several times daily and saline nasal irrigation (AYR is a common brand).  Use Flonase nasal spray each morning after using Afrin nasal spray and saline nasal irrigation. Try warm salt water gargles for sore throat.  Stop all antihistamines for now, and other non-prescription cough/cold preparations. May take Tylenol as needed for pain, fever, etc.   If your COVID-19 test is positive, isolate yourself for five days from the time of your symptom onset.  At the end of five days you may end isolation if your symptoms have cleared or improved, and you have not had a fever for 24 hours. At this time you should wear a mask for five more days when you are around others.             ED Prescriptions     Medication Sig Dispense Auth. Provider   doxycycline (VIBRAMYCIN) 100 MG capsule Take one cap PO Q12hr with food. 14 capsule Lattie Haw, MD   guaiFENesin-codeine 100-10 MG/5ML syrup Take 48mL by mouth at bedtime as needed for cough. 50 mL Lattie Haw, MD         Lattie Haw, MD 06/23/21 [phone]

## 2021-06-23 LAB — NOVEL CORONAVIRUS, NAA: SARS-CoV-2, NAA: DETECTED — AB

## 2021-06-23 LAB — SARS-COV-2, NAA 2 DAY TAT

## 2021-07-21 ENCOUNTER — Emergency Department (INDEPENDENT_AMBULATORY_CARE_PROVIDER_SITE_OTHER)
Admission: EM | Admit: 2021-07-21 | Discharge: 2021-07-21 | Disposition: A | Discharge status: Home / Self Care | Payer: BC Managed Care – PPO | Source: Home / Self Care

## 2021-07-21 ENCOUNTER — Other Ambulatory Visit: Payer: Self-pay

## 2021-07-21 DIAGNOSIS — K219 Gastro-esophageal reflux disease without esophagitis: Secondary | ICD-10-CM

## 2021-07-21 DIAGNOSIS — R0789 Other chest pain: Secondary | ICD-10-CM | POA: Diagnosis not present

## 2021-07-21 MED ORDER — BACLOFEN 10 MG PO TABS: 10.0000 mg | ORAL_TABLET | Freq: Three times a day (TID) | ORAL | 0 refills | Status: DC

## 2021-07-21 MED ORDER — PANTOPRAZOLE SODIUM 40 MG PO TBEC: 40.0000 mg | DELAYED_RELEASE_TABLET | Freq: Every day | ORAL | 0 refills | Status: DC

## 2021-07-21 NOTE — ED Triage Notes (Addendum)
Pt presents to Urgent Care with c/o R-sided chest pain x 1 week; states it feels like a "gas bubble." Reports recent hx of COVID (06/22/21). Denies sob.

## 2021-07-21 NOTE — Discharge Instructions (Addendum)
  Advised patient to hold Soma.  May take baclofen as directed daily, or as needed for chest wall discomfort.  Advised patient to discontinue Omeprazole 20 mg and to start Pantoprazole tomorrow morning, Sunday, 07/22/2021.  Advised patient to take Pantoprazole on empty stomach with 8 ounces of water and to wait 30 to 45 minutes prior to taking any other medication or eating.

## 2021-07-21 NOTE — ED Provider Notes (Signed)
[facility]    CSN: [id number] Arrival date & time: 07/21/21  5621      History   Chief Complaint Chief Complaint  Patient presents with   Chest Pain    R side    HPI Steven Becker is a 60 y.o. male.   HPI 60 year old male presents with right-sided chest pain for 1 week reports feels like a gas bubble.  Patient reports history of COVID-19 on 06/22/2021.  Denies shortness of breath or fever.  PMH significant for GERD currently taking omeprazole (Prilosec OTC) 20 mg daily.  Past Medical History:  Diagnosis Date   Anxiety and depression    Back pain    GERD (gastroesophageal reflux disease)    Hyperlipidemia    Knee pain    Thyroid disease     There are no problems to display for this patient.   Past Surgical History:  Procedure Laterality Date   HAND SURGERY     REPLACEMENT TOTAL KNEE     ROTATOR CUFF REPAIR Bilateral        Home Medications    Prior to Admission medications   Medication Sig Start Date End Date Taking? Authorizing Provider  baclofen (LIORESAL) 10 MG tablet Take 1 tablet (10 mg total) by mouth 3 (three) times daily. 07/21/21  Yes Trevor Iha, FNP  pantoprazole (PROTONIX) 40 MG tablet Take 1 tablet (40 mg total) by mouth daily. 07/21/21 08/20/21 Yes Trevor Iha, FNP  amLODipine (NORVASC) 5 MG tablet Take 5 mg by mouth daily. 05/24/21   [provider]  doxycycline (VIBRAMYCIN) 100 MG capsule Take one cap PO Q12hr with food. 06/22/21   Lattie Haw, MD  fenofibrate 54 MG tablet Take by mouth. 10/01/19   [provider]  fluticasone (FLONASE) 50 MCG/ACT nasal spray Place into the nose. 12/23/18   [provider]  guaiFENesin-codeine 100-10 MG/5ML syrup Take 23mL by mouth at bedtime as needed for cough. 06/22/21   Lattie Haw, MD  levothyroxine (SYNTHROID) 75 MCG tablet TAKE 1 TABLET BY MOUTH EVERY DAY 03/08/19   [provider]  lisinopril (ZESTRIL) 20 MG tablet Take 20 mg by mouth daily. 05/07/21    [provider]  meloxicam (MOBIC) 15 MG tablet Take 15 mg by mouth at bedtime.    [provider]  omeprazole (PRILOSEC OTC) 20 MG tablet Take by mouth. 03/08/19   [provider]  predniSONE (DELTASONE) 20 MG tablet Take 1 tablet (20 mg total) by mouth 2 (two) times daily with a meal. X 5 days 07/07/20   Lajean Manes, MD  sertraline (ZOLOFT) 50 MG tablet Take one tablet (50 MG Dose) by mouth daily. 03/08/19   [provider]  simvastatin (ZOCOR) 40 MG tablet TAKE 1 TABLET BY MOUTH EVERYDAY AT BEDTIME 08/31/19   [provider]  Testosterone 20.25 MG/ACT (1.62%) GEL SMARTSIG:3 Pump Topical Daily 09/11/19   [provider]    Family History Family History  Problem Relation Age of Onset   Hypertension Mother    Leukemia Father     Social History Social History   Tobacco Use   Smoking status: Former   Smokeless tobacco: Former  Building services engineer Use: Never used  Substance Use Topics   Alcohol use: Yes    Comment: occ     Allergies   Hydrocodone   Review of Systems Review of Systems  Cardiovascular:        Epigastric pain/gastritis, gas bubble x1 week  All  other systems reviewed and are negative.   Physical Exam Triage Vital Signs ED Triage Vitals  Enc Vitals Group     BP 07/21/21 0947 134/81     Pulse Rate 07/21/21 0947 84     Resp 07/21/21 0947 (!) 24     Temp 07/21/21 0947 98 F (36.7 C)     Temp Source 07/21/21 0947 Oral     SpO2 07/21/21 0947 97 %     Weight 07/21/21 0944 290 lb (131.5 kg)     Height 07/21/21 0944 6' (1.829 m)     Head Circumference --      Peak Flow --      Pain Score 07/21/21 0944 7     Pain Loc --      Pain Edu? --      Excl. in GC? --    No data found.  Updated Vital Signs BP 134/81 (BP Location: Right Arm)   Pulse 84   Temp 98 F (36.7 C) (Oral)   Resp (!) 24   Ht 6' (1.829 m)   Wt 290 lb (131.5 kg)   SpO2 97%   BMI 39.33 kg/m    Physical Exam Vitals and nursing note  reviewed.  Constitutional:      Appearance: Normal appearance. He is obese.  HENT:     Head: Normocephalic and atraumatic.     Mouth/Throat:     Mouth: Mucous membranes are moist.     Pharynx: Oropharynx is clear.  Eyes:     Extraocular Movements: Extraocular movements intact.     Conjunctiva/sclera: Conjunctivae normal.     Pupils: Pupils are equal, round, and reactive to light.  Neck:     Vascular: No carotid bruit.     Comments: No JVD Cardiovascular:     Rate and Rhythm: Normal rate and regular rhythm.     Pulses: Normal pulses.     Heart sounds: Normal heart sounds. No murmur heard. Pulmonary:     Effort: Pulmonary effort is normal. No respiratory distress.     Breath sounds: Normal breath sounds. No stridor. No wheezing, rhonchi or rales.  Musculoskeletal:        General: Normal range of motion.     Cervical back: Normal range of motion and neck supple. No rigidity.  Skin:    General: Skin is warm and dry.  Neurological:     General: No focal deficit present.     Mental Status: He is alert and oriented to person, place, and time. Mental status is at baseline.  Psychiatric:        Mood and Affect: Mood normal.        Behavior: Behavior normal.        Thought Content: Thought content normal.     UC Treatments / Results  Labs (all labs ordered are listed, but only abnormal results are displayed) Labs Reviewed - No data to display  EKG   Radiology No results found.  Procedures Procedures (including critical care time)  Medications Ordered in UC Medications - No data to display  Initial Impression / Assessment and Plan / UC Course  I have reviewed the triage vital signs and the nursing notes.  Pertinent labs & imaging results that were available during my care of the patient were reviewed by me and considered in my medical decision making (see chart for details).     MDM: 1. Chest wall pain-Rx'd Baclofen, 2.  GERD-advised patient to discontinue omeprazole  20 mg daily  and to start Protonix (pantoprazole 40 mg, daily). Advised patient may take baclofen as directed daily, or as needed for chest wall discomfort.  Advised patient to discontinue Omeprazole 20 mg and to start Pantoprazole tomorrow morning, Sunday, 07/22/2021.  Advised patient to take Pantoprazole on empty stomach with 8 ounces of water and to wait 30 to 45 minutes prior to taking any other medication or eating. Final Clinical Impressions(s) / UC Diagnoses   Final diagnoses:  Chest wall pain  Gastroesophageal reflux disease without esophagitis     Discharge Instructions       Advised patient to hold Soma.  May take baclofen as directed daily, or as needed for chest wall discomfort.  Advised patient to discontinue Omeprazole 20 mg and to start Pantoprazole tomorrow morning, Sunday, 07/22/2021.  Advised patient to take Pantoprazole on empty stomach with 8 ounces of water and to wait 30 to 45 minutes prior to taking any other medication or eating.     ED Prescriptions     Medication Sig Dispense Auth. Provider   baclofen (LIORESAL) 10 MG tablet Take 1 tablet (10 mg total) by mouth 3 (three) times daily. 30 each Trevor Iha, FNP   pantoprazole (PROTONIX) 40 MG tablet Take 1 tablet (40 mg total) by mouth daily. 30 tablet Trevor Iha, FNP      PDMP not reviewed this encounter.   Ketrick, Matney, FNP 07/21/21 1059

## 2023-09-21 ENCOUNTER — Other Ambulatory Visit: Payer: Self-pay

## 2023-09-21 ENCOUNTER — Ambulatory Visit
Admission: RE | Admit: 2023-09-21 | Discharge: 2023-09-21 | Disposition: A | Discharge status: Home / Self Care | Payer: Managed Care, Other (non HMO) | Source: Ambulatory Visit | Attending: Family Medicine | Admitting: Family Medicine

## 2023-09-21 VITALS — BP 136/73 | HR 94 | Temp 97.7°F | Resp 16

## 2023-09-21 DIAGNOSIS — U071 COVID-19: Secondary | ICD-10-CM

## 2023-09-21 LAB — POC SARS CORONAVIRUS 2 AG -  ED: SARS Coronavirus 2 Ag: POSITIVE — AB

## 2023-09-21 MED ORDER — PAXLOVID (300/100) 20 X 150 MG & 10 X 100MG PO TBPK: ORAL_TABLET | ORAL | 0 refills | Status: DC

## 2023-09-21 NOTE — Discharge Instructions (Signed)
Drink lots of liquids May take Tylenol or ibuprofen for pain and fever May use over-the-counter cough or cold medicines as needed Take Paxlovid 2 times a day for 5 days Do not take cholesterol pill (like Zocor or Crestor) while on Paxil.  It is safe to hold this for 5 days. Stay home and quarantine until your symptoms are improved.  Wear a mask for 10 days when around others

## 2023-09-21 NOTE — ED Triage Notes (Signed)
Sick since Friday: cough, chest congestion, sinus congestion, tooth pain. Unsure of fever.

## 2023-09-21 NOTE — ED Provider Notes (Addendum)
[facility]    CSN: [id number] Arrival date & time: 09/21/23  0856      History   Chief Complaint Chief Complaint  Patient presents with   Nasal Congestion    HPI Steven Becker is a 62 y.o. male.   Pleasant 62 year old gentleman.  Here with upper respiratory infection.  Started with symptoms on Friday that got worse yesterday.  This morning when he woke up he had runny and stuffy nose, congestion, fatigue.  Cough.  States his teeth hurt on the lower left jaw, although he has no known dental problems.  No exposure to COVID.  Non-smoker.    Past Medical History:  Diagnosis Date   Anxiety and depression    Back pain    GERD (gastroesophageal reflux disease)    Hyperlipidemia    Knee pain    Thyroid disease     There are no problems to display for this patient.   Past Surgical History:  Procedure Laterality Date   HAND SURGERY     REPLACEMENT TOTAL KNEE     ROTATOR CUFF REPAIR Bilateral        Home Medications    Prior to Admission medications   Medication Sig Start Date End Date Taking? Authorizing Provider  nirmatrelvir & ritonavir (PAXLOVID, 300/100,) 20 x 150 MG & 10 x 100MG  TBPK Take as directed 09/21/23  Yes Eustace Moore, MD  rosuvastatin (CRESTOR) 20 MG tablet Take 20 mg by mouth daily.   Yes [provider]  sildenafil (VIAGRA) 100 MG tablet Take 100 mg by mouth daily as needed for erectile dysfunction.   Yes [provider]  tadalafil (CIALIS) 5 MG tablet Take 5 mg by mouth daily as needed for erectile dysfunction.   Yes [provider]  amLODipine (NORVASC) 5 MG tablet Take 5 mg by mouth daily. 05/24/21   [provider]  fenofibrate 54 MG tablet Take by mouth. 10/01/19   [provider]  levothyroxine (SYNTHROID) 75 MCG tablet TAKE 1 TABLET BY MOUTH EVERY DAY 03/08/19   [provider]  lisinopril (ZESTRIL) 20 MG tablet Take 20 mg by mouth daily. 05/07/21   [provider]   omeprazole (PRILOSEC OTC) 20 MG tablet Take by mouth. 03/08/19   [provider]  pantoprazole (PROTONIX) 40 MG tablet Take 1 tablet (40 mg total) by mouth daily. 07/21/21 08/20/21  Trevor Iha, FNP  sertraline (ZOLOFT) 50 MG tablet Take one tablet (50 MG Dose) by mouth daily. 03/08/19   [provider]  simvastatin (ZOCOR) 40 MG tablet TAKE 1 TABLET BY MOUTH EVERYDAY AT BEDTIME 08/31/19   [provider]  Testosterone 20.25 MG/ACT (1.62%) GEL SMARTSIG:3 Pump Topical Daily 09/11/19   [provider]    Family History Family History  Problem Relation Age of Onset   Hypertension Mother    Leukemia Father     Social History Social History   Tobacco Use   Smoking status: Former   Smokeless tobacco: Former  Building services engineer status: Never Used  Substance Use Topics   Alcohol use: Yes    Comment: occ     Allergies   Patient has no active allergies.   Review of Systems Review of Systems See HPI  Physical Exam Triage Vital Signs ED Triage Vitals  Encounter Vitals Group     BP 09/21/23 0904 136/73     Systolic BP Percentile --      Diastolic BP Percentile --  Pulse Rate 09/21/23 0904 94     Resp 09/21/23 0904 16     Temp 09/21/23 0904 97.7 F (36.5 C)     Temp Source 09/21/23 0904 Oral     SpO2 09/21/23 0904 99 %     Weight --      Height --      Head Circumference --      Peak Flow --      Pain Score 09/21/23 0905 7     Pain Loc --      Pain Education --      Exclude from Growth Chart --    No data found.  Updated Vital Signs BP 136/73 (BP Location: Left Arm)   Pulse 94   Temp 97.7 F (36.5 C) (Oral)   Resp 16   SpO2 99%        Physical Exam Constitutional:      General: He is not in acute distress.    Appearance: He is well-developed. He is ill-appearing.  HENT:     Head: Normocephalic and atraumatic.     Right Ear: Tympanic membrane and ear canal normal.     Left Ear: Tympanic membrane and ear canal  normal.     Nose: Congestion present. No rhinorrhea.     Mouth/Throat:     Mouth: Mucous membranes are moist.     Pharynx: No posterior oropharyngeal erythema.  Eyes:     Conjunctiva/sclera: Conjunctivae normal.     Pupils: Pupils are equal, round, and reactive to light.  Cardiovascular:     Rate and Rhythm: Tachycardia present.     Heart sounds: Normal heart sounds.  Pulmonary:     Effort: Pulmonary effort is normal. No respiratory distress.     Breath sounds: Normal breath sounds.  Abdominal:     General: There is no distension.     Palpations: Abdomen is soft.  Musculoskeletal:        General: Normal range of motion.     Cervical back: Normal range of motion.  Lymphadenopathy:     Cervical: No cervical adenopathy.  Skin:    General: Skin is warm and dry.  Neurological:     Mental Status: He is alert.      UC Treatments / Results  Labs (all labs ordered are listed, but only abnormal results are displayed) Labs Reviewed  POC SARS CORONAVIRUS 2 AG -  ED - Abnormal; Notable for the following components:      Result Value   SARS Coronavirus 2 Ag Positive (*)    All other components within normal limits    EKG   Radiology No results found.  Procedures Procedures (including critical care time)  Medications Ordered in UC Medications - No data to display  Initial Impression / Assessment and Plan / UC Course  I have reviewed the triage vital signs and the nursing notes.  Pertinent labs & imaging results that were available during my care of the patient were reviewed by me and considered in my medical decision making (see chart for details).    Discussed Paxlovid with patient.  Pros and cons.  Patient chooses to take Paxlovid.  His kidney function is reviewed and recently normal. Final Clinical Impressions(s) / UC Diagnoses   Final diagnoses:  COVID-19     Discharge Instructions      Drink lots of liquids May take Tylenol or ibuprofen for pain and  fever May use over-the-counter cough or cold medicines as needed Take Paxlovid 2  times a day for 5 days Do not take cholesterol pill (like Zocor or Crestor) while on Paxil.  It is safe to hold this for 5 days. Stay home and quarantine until your symptoms are improved.  Wear a mask for 10 days when around others     ED Prescriptions     Medication Sig Dispense Auth. Provider   nirmatrelvir & ritonavir (PAXLOVID, 300/100,) 20 x 150 MG & 10 x 100MG  TBPK Take as directed 1 each Delton See Letta Pate, MD      PDMP not reviewed this encounter.   Eustace Moore, MD 09/21/23 2130    Eustace Moore, MD 09/21/23 1025

## 2023-12-28 ENCOUNTER — Encounter: Payer: Self-pay | Admitting: Emergency Medicine

## 2023-12-28 ENCOUNTER — Ambulatory Visit
Admission: EM | Admit: 2023-12-28 | Discharge: 2023-12-28 | Disposition: A | Discharge status: Home / Self Care | Payer: BC Managed Care – PPO | Attending: Family Medicine | Admitting: Family Medicine

## 2023-12-28 DIAGNOSIS — J069 Acute upper respiratory infection, unspecified: Secondary | ICD-10-CM

## 2023-12-28 MED ORDER — METHYLPREDNISOLONE SODIUM SUCC 125 MG IJ SOLR
80.0000 mg | Freq: Once | INTRAMUSCULAR | Status: AC
  Administered 2023-12-28: 80 mg via INTRAMUSCULAR

## 2023-12-28 MED ORDER — PREDNISONE 50 MG PO TABS: ORAL_TABLET | ORAL | 0 refills | Status: AC

## 2023-12-28 NOTE — ED Provider Notes (Signed)
 Steven Becker    CSN: [id number] Arrival date & time: 12/28/23  9166      History   Chief Complaint Chief Complaint  Patient presents with   Cough    HPI Steven Becker is a 63 y.o. male.   HPI Patient states that he has had sinus congestion with sinus drainage, postnasal drip, cough for the last 3 days.  He states he can hardly breathe through his nose.  He states the symptoms kept him awake last night.  He has tried DayQuil, NyQuil, Sudafed, nasal spray.  He is here requesting steroids.  He states this has helped him recover more quickly in the past. Past Medical History:  Diagnosis Date   Anxiety and depression    Back pain    GERD (gastroesophageal reflux disease)    Hyperlipidemia    Knee pain    Thyroid disease     There are no active problems to display for this patient.   Past Surgical History:  Procedure Laterality Date   HAND SURGERY     REPLACEMENT TOTAL KNEE     ROTATOR CUFF REPAIR Bilateral        Home Medications    Prior to Admission medications   Medication Sig Start Date End Date Taking? Authorizing Provider  amLODipine (NORVASC) 5 MG tablet Take 5 mg by mouth daily. 05/24/21  Yes [provider]  ciclopirox (PENLAC) 8 % solution Apply topically at bedtime. 12/02/23 03/01/24 Yes [provider]  fenofibrate 54 MG tablet Take by mouth. 10/01/19  Yes [provider]  levothyroxine (SYNTHROID) 75 MCG tablet TAKE 1 TABLET BY MOUTH EVERY DAY 03/08/19  Yes [provider]  lisinopril (ZESTRIL) 20 MG tablet Take 20 mg by mouth daily. 05/07/21  Yes [provider]  omeprazole (PRILOSEC OTC) 20 MG tablet Take by mouth. 03/08/19  Yes [provider]  predniSONE  (DELTASONE ) 50 MG tablet Take once a day for 5 days.  Take with food 12/28/23  Yes Steven Jamee Jacob, MD  rosuvastatin (CRESTOR) 20 MG tablet Take 20 mg by mouth daily.   Yes [provider]  sildenafil (VIAGRA) 100 MG tablet Take 100  mg by mouth daily as needed for erectile dysfunction.   Yes [provider]  simvastatin (ZOCOR) 40 MG tablet TAKE 1 TABLET BY MOUTH EVERYDAY AT BEDTIME 08/31/19  Yes [provider]  tadalafil (CIALIS) 5 MG tablet Take 5 mg by mouth daily as needed for erectile dysfunction.   Yes [provider]  terbinafine (LAMISIL) 250 MG tablet Take 250 mg by mouth daily. 12/02/23  Yes [provider]  Testosterone 20.25 MG/ACT (1.62%) GEL SMARTSIG:3 Pump Topical Daily 09/11/19  Yes [provider]    Family History Family History  Problem Relation Age of Onset   Hypertension Mother    Leukemia Father     Social History Social History   Tobacco Use   Smoking status: Former   Smokeless tobacco: Former  Building Services Engineer status: Never Used  Substance Use Topics   Alcohol use: Yes    Comment: occ   Drug use: Never     Allergies   Patient has no known allergies.   Review of Systems Review of Systems See HPI  Physical Exam Triage Vital Signs ED Triage Vitals  Encounter Vitals Group     BP 12/28/23 0910 127/80     Systolic BP Percentile --      Diastolic BP Percentile --  Pulse Rate 12/28/23 0910 78     Resp 12/28/23 0910 18     Temp 12/28/23 0910 97.7 F (36.5 C)     Temp Source 12/28/23 0910 Oral     SpO2 12/28/23 0910 96 %     Weight 12/28/23 0913 270 lb (122.5 kg)     Height 12/28/23 0913 6' (1.829 m)     Head Circumference --      Peak Flow --      Pain Score 12/28/23 0912 7     Pain Loc --      Pain Education --      Exclude from Growth Chart --    No data found.  Updated Vital Signs BP 127/80 (BP Location: Right Arm)   Pulse 78   Temp 97.7 F (36.5 C) (Oral)   Resp 18   Ht 6' (1.829 m)   Wt 122.5 kg   SpO2 96%   BMI 36.62 kg/m     Physical Exam Constitutional:      General: He is not in acute distress.    Appearance: He is well-developed. He is obese. He is ill-appearing.  HENT:     Head: Normocephalic  and atraumatic.     Right Ear: Tympanic membrane and ear canal normal.     Left Ear: Tympanic membrane and ear canal normal.     Nose: Congestion and rhinorrhea present.     Mouth/Throat:     Pharynx: Posterior oropharyngeal erythema present.  Eyes:     Conjunctiva/sclera: Conjunctivae normal.     Pupils: Pupils are equal, round, and reactive to light.  Cardiovascular:     Rate and Rhythm: Normal rate.     Heart sounds: Normal heart sounds.  Pulmonary:     Effort: Pulmonary effort is normal. No respiratory distress.     Breath sounds: Normal breath sounds.  Abdominal:     General: There is no distension.     Palpations: Abdomen is soft.  Musculoskeletal:        General: Normal range of motion.     Cervical back: Normal range of motion.  Lymphadenopathy:     Cervical: Cervical adenopathy present.  Skin:    General: Skin is warm and dry.  Neurological:     Mental Status: He is alert.      UC Treatments / Results  Labs (all labs ordered are listed, but only abnormal results are displayed) Labs Reviewed - No data to display  EKG   Radiology No results found.  Procedures Procedures (including critical Becker time)  Medications Ordered in UC Medications  methylPREDNISolone  sodium succinate (SOLU-MEDROL ) 125 mg/2 mL injection 80 mg (80 mg Intramuscular Given 12/28/23 0935)    Initial Impression / Assessment and Plan / UC Course  I have reviewed the triage vital signs and the nursing notes.  Pertinent labs & imaging results that were available during my Becker of the patient were reviewed by me and considered in my medical decision making (see chart for details).     Final Clinical Impressions(s) / UC Diagnoses   Final diagnoses:  Acute upper respiratory infection     Discharge Instructions      Take prednisone  once a day for 5 days.  Start tomorrow Continue to drink lots of liquids May use over-the-counter cough or cold medicines as needed Call or return for  problems   ED Prescriptions     Medication Sig Dispense Auth. Provider   predniSONE  (DELTASONE ) 50 MG tablet Take once  a day for 5 days.  Take with food 5 tablet Steven Jamee Jacob, MD      PDMP not reviewed this encounter.   Steven Jamee Jacob, MD 12/28/23 [phone]

## 2023-12-28 NOTE — Discharge Instructions (Signed)
 Take prednisone once a day for 5 days.  Start tomorrow Continue to drink lots of liquids May use over-the-counter cough or cold medicines as needed Call or return for problems

## 2023-12-28 NOTE — ED Triage Notes (Signed)
 Patient c/o cough, worse at night, nasal drainage down throat x 3 days.  Afebrile.  Patient has been taken Nyquil.

## 2023-12-29 ENCOUNTER — Ambulatory Visit: Payer: Self-pay
